# Patient Record
Sex: Male | Born: 1975 | Race: Black or African American | Hispanic: No | Marital: Single | Smoking: Current every day smoker
Health system: Southern US, Community
[De-identification: ages and names within clinical notes are randomized; demographics above are authoritative.]

## PROBLEM LIST (undated history)

## (undated) HISTORY — PX: OTHER SURGICAL HISTORY: SHX169

---

## 2004-01-29 ENCOUNTER — Inpatient Hospital Stay (HOSPITAL_COMMUNITY): Admission: EM | Admit: 2004-01-29 | Discharge: 2004-02-06 | Payer: Self-pay | Admitting: *Deleted

## 2004-02-03 ENCOUNTER — Encounter (INDEPENDENT_AMBULATORY_CARE_PROVIDER_SITE_OTHER): Payer: Self-pay | Admitting: *Deleted

## 2004-02-13 ENCOUNTER — Encounter: Admission: RE | Admit: 2004-02-13 | Discharge: 2004-02-13 | Payer: Self-pay | Admitting: Thoracic Surgery

## 2005-07-17 IMAGING — CR DG CHEST 1V PORT
1 series · 1 of 1 positions shown · non-contrast
Comparison: 01/29/04.

CLINICAL DATA: Pneumothorax. 
 CHEST PORTABLE, ONE VIEW 01/30/04 AT 5855 HOURS

[view not recorded]
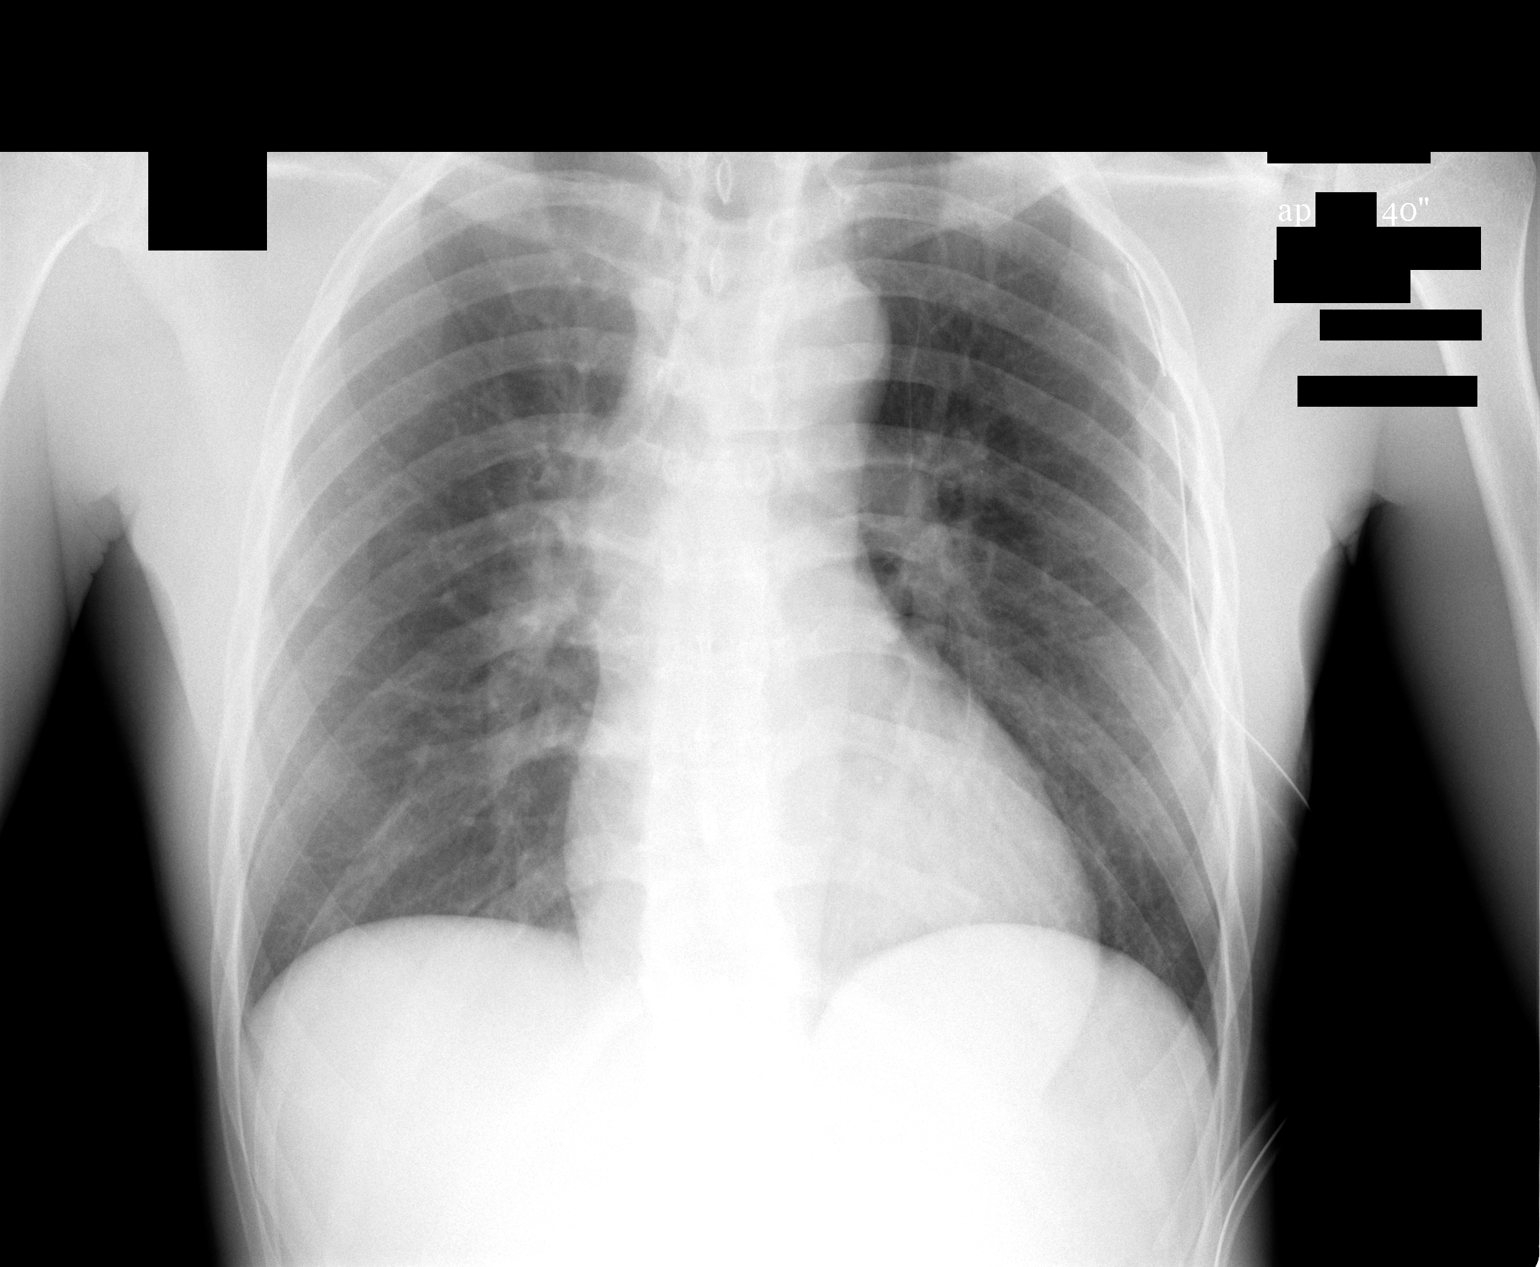

[1 of 1 positions shown; findings below may reference images not displayed]

FINDINGS: The left chest tube is stable.  A tiny left pneumothorax is unchanged.  The lungs are clear.  No effusions are seen. 
 IMPRESSION
 Stable tiny left apical pneumothorax.

## 2005-07-18 IMAGING — CR DG CHEST 1V PORT
1 series · 1 of 1 positions shown · non-contrast
Comparison: 01/30/04.

CLINICAL DATA: Spontaneous pneumothorax, chest tube. 
 CHEST PORTABLE, ONE VIEW 01/31/04 AT 4744 HOURS

[view not recorded]
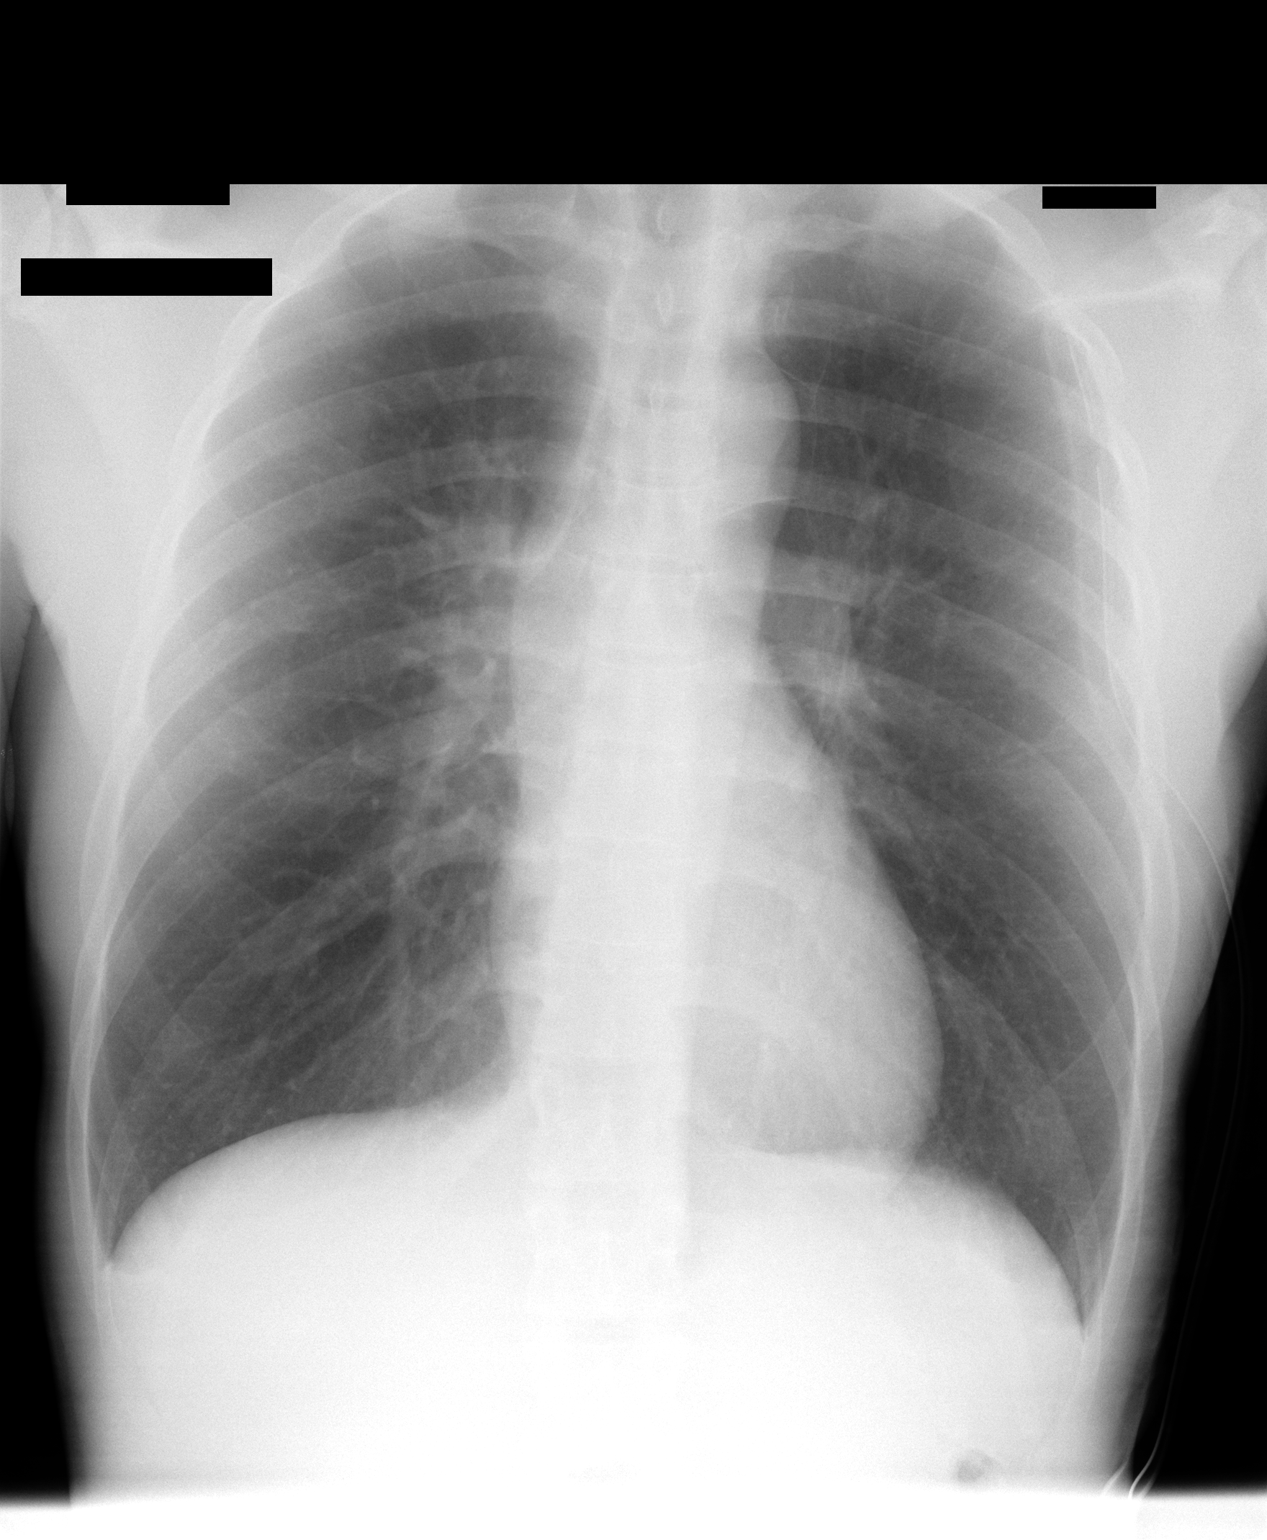

[1 of 1 positions shown; findings below may reference images not displayed]

Left-sided chest tube remains in place.   Previously-described small left apical pneumothorax not visualized on today?s study.  There is hyperinflation of the lungs.  No focal airspace opacities.  Heart is normal size. 
 IMPRESSION
 Left chest tube remains in place.  No definite pneumothorax.

## 2005-07-19 IMAGING — CT CT CHEST W/ CM
1 of 2 series · 14 of 30 positions shown, 18 images · IV contrast (100 ML OMNI 300)
Comparison: Multiple recent chest radiographs.

CLINICAL DATA: Left-sided spontaneous pneumothorax.  The patient is status post original insertion and now reinsertion of chest tube due to recurrent pneumothorax.  Further evaluation now performed with CT of the chest. 
 CT OF THE CHEST WITH CONTRAST 02/01/04

[Series 2: routine chest · axial · 0.70mm/px · z∈[-322,-22]mm · 14 of 71 slices shown, 18 images]
[im 6/71  mediastinal]
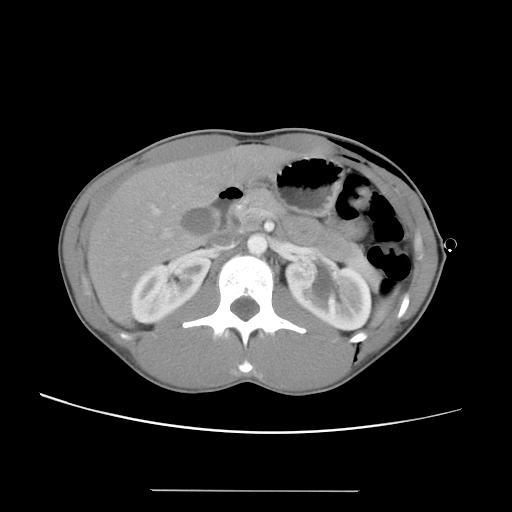
[im 6/71  lung]
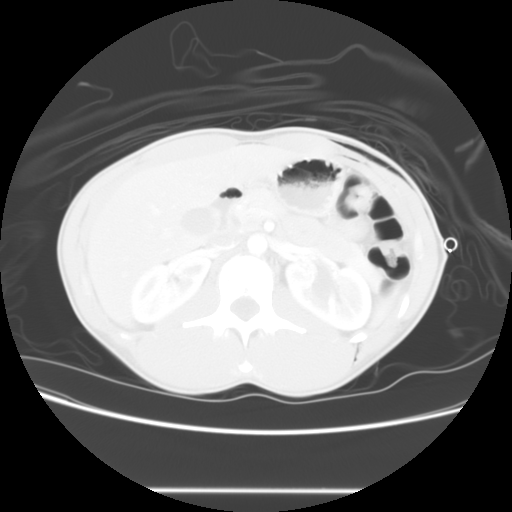
[im 11/71  lung]
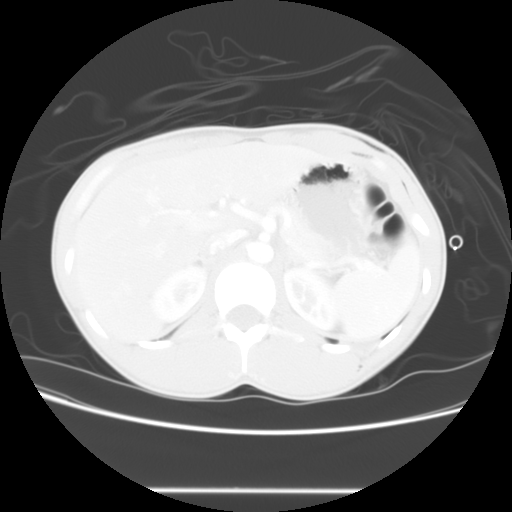
[im 16/71  lung]
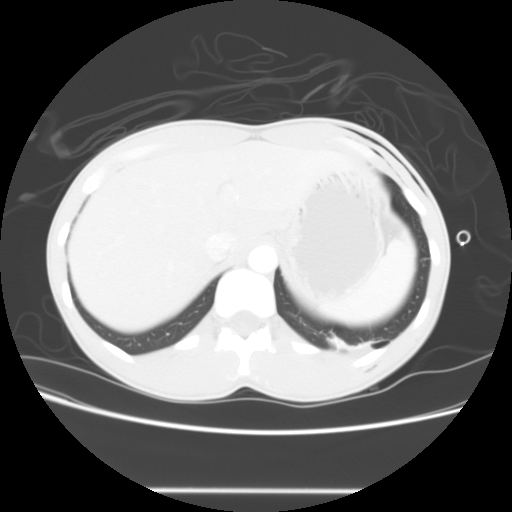
[im 21/71  lung]
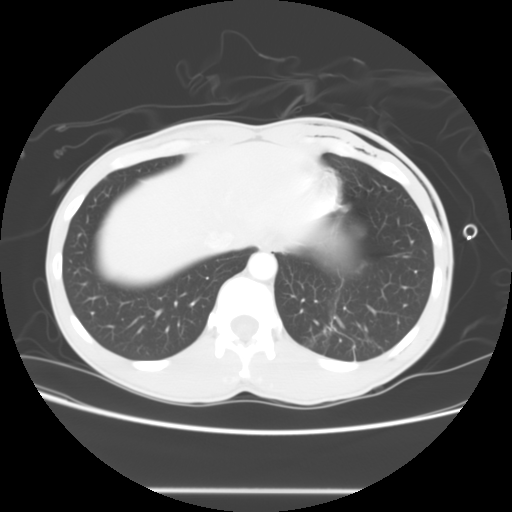
[im 26/71  mediastinal]
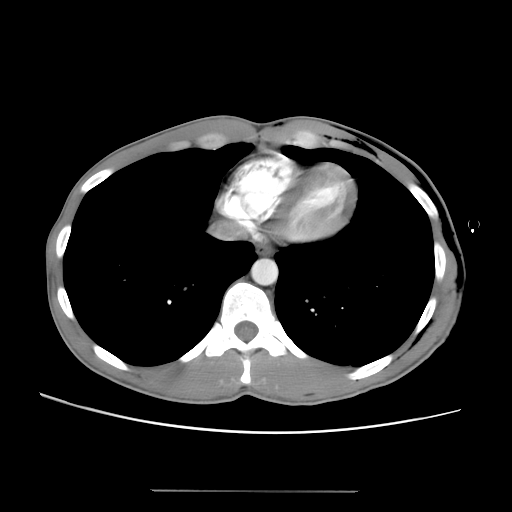
[im 26/71  lung]
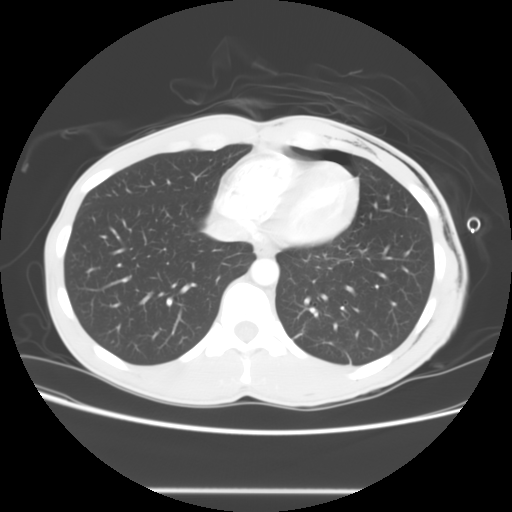
[im 31/71  lung]
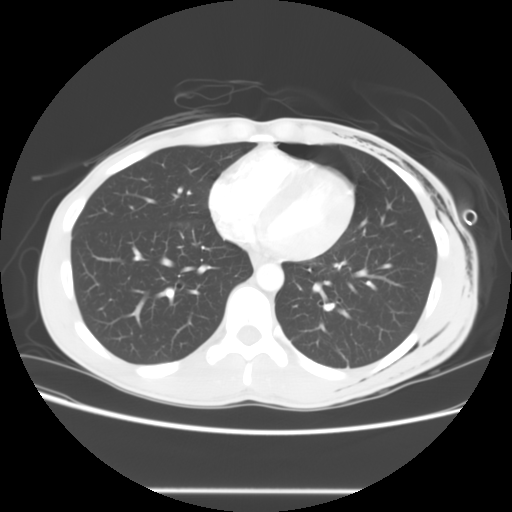
[im 35/71  lung]
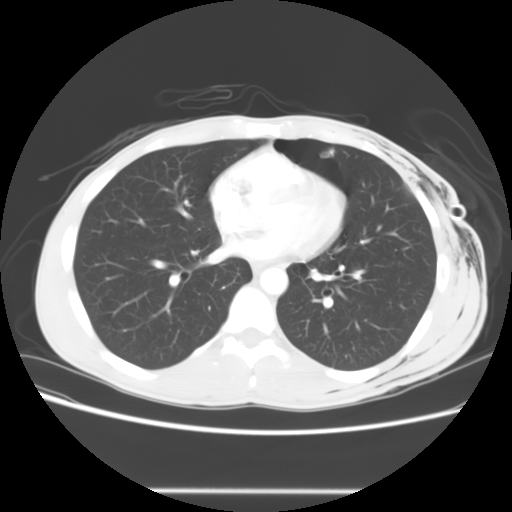
[im 36/71  lung]
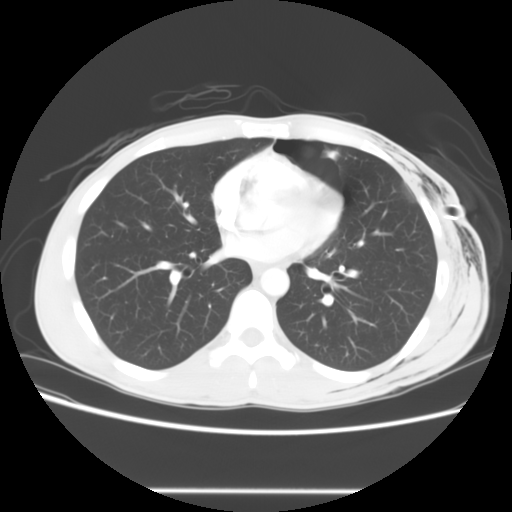
[im 41/71  mediastinal]
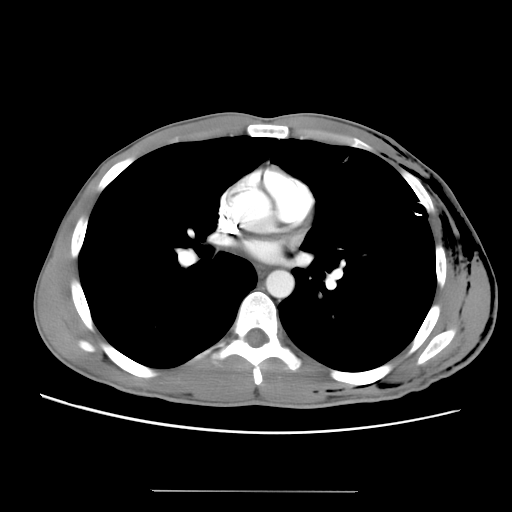
[im 41/71  lung]
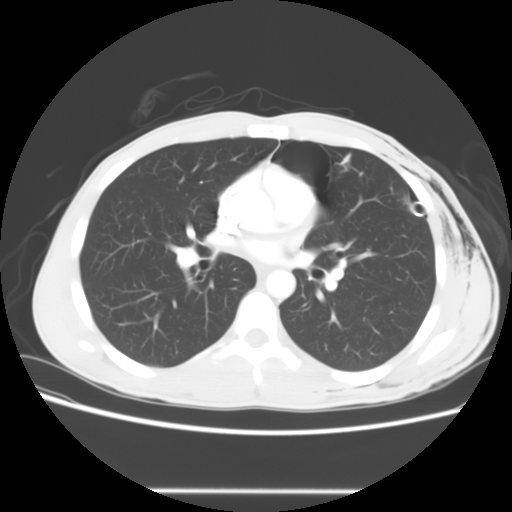
[im 46/71  lung]
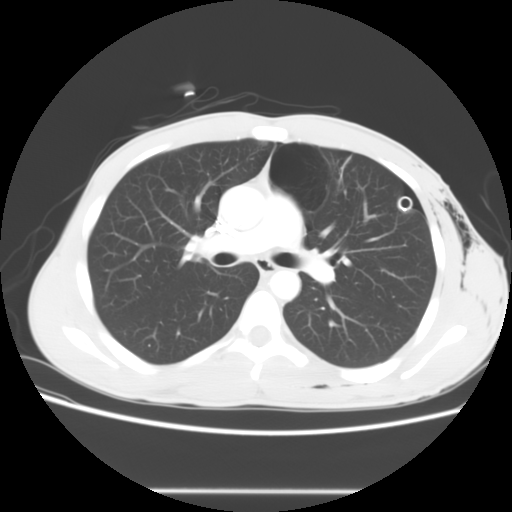
[im 51/71  lung]
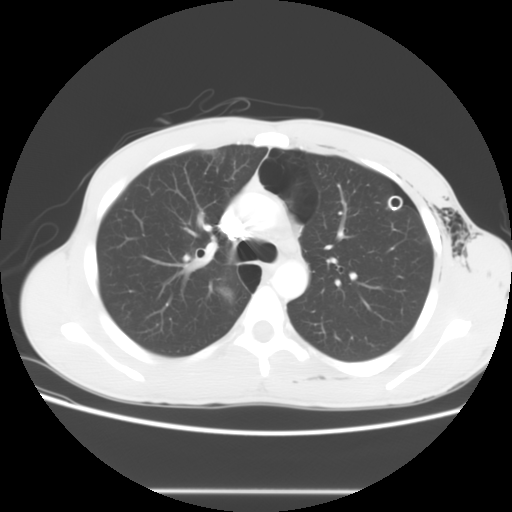
[im 56/71  lung]
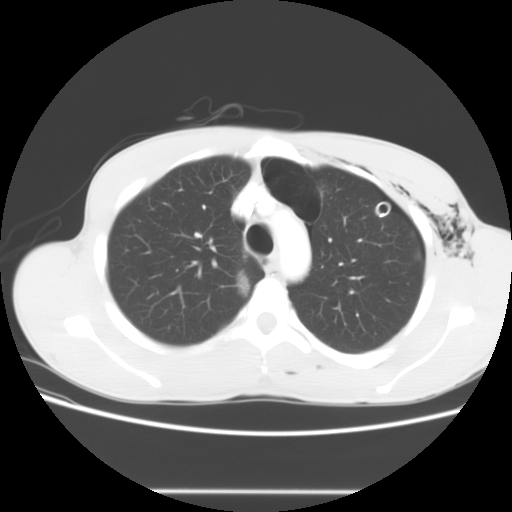
[im 61/71  mediastinal]
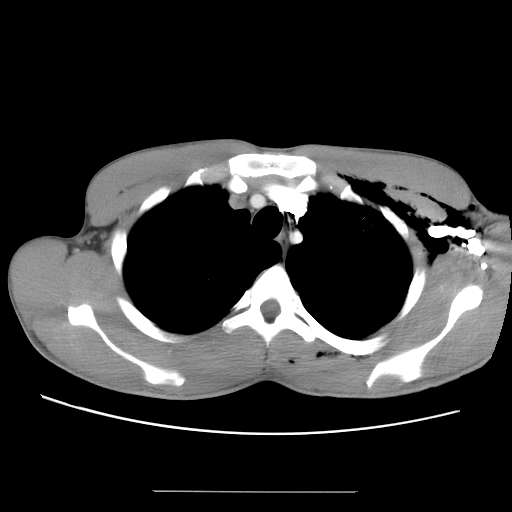
[im 61/71  lung]
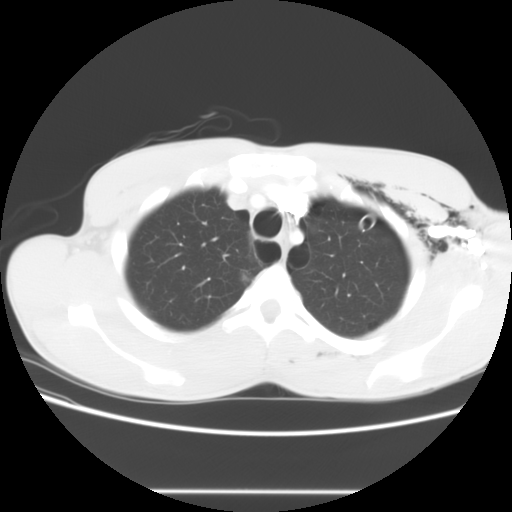
[im 66/71  lung]
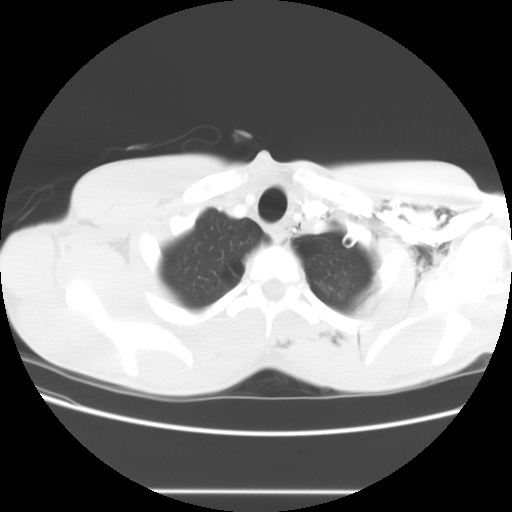

[14 of 30 positions shown; findings below may reference images not displayed]

Contrast:  100 cc of Omnipaque 300 IV. 
 Anterolateral chest tube in place with minimal residual component of apical pneumothorax present.  There are prominent medial blebs present adjacent to the mediastinum and extending all the way to the left lung apex.  Largest diameter of a single bleb is 5.8 cm.  Some subcutaneous air is present in the upper anterior chest wall and overlying the left lung apex.  No evidence of pleural fluid.  Minimal atelectasis at the left lung base.  The right lung demonstrates smaller posterior and medial bleb formation in the region of the azygoesophageal recess.  No pulmonary nodules or infiltrates.  
 IMPRESSION
 Near complete re-extension of the left lung after chest tube placement with only minimal component of pneumothorax remaining.  There is prominent bleb formation in the left anterior and medial hemithorax abutting the mediastinum.  Maximal bleb diameter is 5.8 cm in this region.  Smaller bleb in the region of the azygoesophageal recess on the right.

## 2006-09-11 ENCOUNTER — Emergency Department (HOSPITAL_COMMUNITY): Admission: EM | Admit: 2006-09-11 | Discharge: 2006-09-11 | Payer: Self-pay | Admitting: Emergency Medicine

## 2007-07-20 ENCOUNTER — Emergency Department (HOSPITAL_COMMUNITY): Admission: EM | Admit: 2007-07-20 | Discharge: 2007-07-20 | Payer: Self-pay | Admitting: Emergency Medicine

## 2007-12-15 ENCOUNTER — Emergency Department (HOSPITAL_COMMUNITY): Admission: AC | Admit: 2007-12-15 | Discharge: 2007-12-15 | Payer: Self-pay

## 2008-09-22 ENCOUNTER — Emergency Department (HOSPITAL_COMMUNITY): Admission: EM | Admit: 2008-09-22 | Discharge: 2008-09-22 | Payer: Self-pay | Admitting: Emergency Medicine

## 2010-10-04 ENCOUNTER — Encounter: Payer: Self-pay | Admitting: Thoracic Surgery
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
## 2011-01-29 NOTE — Op Note (Signed)
NAME:  Logan Huber, Logan Huber                          ACCOUNT NO.:  [id number]   MEDICAL RECORD NO.:  [id number]                   PATIENT TYPE:  INP   LOCATION:  5706                                 FACILITY:  [facility]   PHYSICIAN:  Ines Bloomer, M.D.              DATE OF BIRTH:  1976-03-24   DATE OF PROCEDURE:  02/03/2004  DATE OF DISCHARGE:                                 OPERATIVE REPORT   PREOPERATIVE DIAGNOSIS:  Persistent left pneumothorax with apical bulla.   POSTOPERATIVE DIAGNOSIS:  Persistent left pneumothorax with apical bulla.   OPERATION PERFORMED:  Left video assisted thoracoscopic surgery resection of  apical bulla.  Pleurectomy.   SURGEON:  Ines Bloomer, M.D.   ASSISTANT:  Coral Ceo, P.A.   ANESTHESIA:  General.   DESCRIPTION OF PROCEDURE:  After percutaneous insertion of all monitoring  lines, the patient underwent general anesthesia.  He was prepped and draped  in the usual sterile manner.  He was turned to the left lateral thoracotomy  position.  Two trocar sites were made in the anterior chest.  The previous  chest tube was removed and this was used for one trocar site and another  trocar site was made in the sixth intercostal space at the posterior  axillary line.  Two trocars were inserted. A 30 degree scope was inserted  and the patient had bulla in the apical posterior segment of the left upper  lobe.  No other bulla were seen except in this area but he had very large  bulla 3 to 4 cm in size.  A third incision was made over the third  intercostal space in the midaxillary line and a 1-1/2 Kaiser ring forceps  was inserted, grasping the largest bulla and this was then resected coming  in laterally with an EZ-45 Ethicon stapler, the bulla was removed and  another smaller one was identified at the apex and this was resected with  two applications of the stapler and finally a third one was identified in  between the superior and inferior bulla and this was  resected with the EZ-45  stapler.  It was checked for air leaks and none found. Then using the 2S and  4S as well as Kaiser ring forceps the pleura was stripped off the apex  superiorly, medially, laterally down to the seventh rib.  After this had  been done under direct vision with the scope, Marcaine block was done in the  usual fashion.  Two chest tubes were inserted through the lower trocar sites  and tied in place with 0 silk. The upper incision was closed with 3-0 Vicryl  and Dermabond for the skin.  Patient was returned to the recovery room in  stable condition.  Ines Bloomer, M.D.    DPB/MEDQ  D:  02/03/2004  T:  02/03/2004  Job:  [id number]

## 2011-01-29 NOTE — Discharge Summary (Signed)
Logan Huber, Logan Huber                          ACCOUNT NO.:  [id number]   MEDICAL RECORD NO.:  [id number]                   PATIENT TYPE:  INP   LOCATION:  2028                                 FACILITY:  [facility]   PHYSICIAN:  Ines Bloomer, M.D.              DATE OF BIRTH:  1976-08-15   DATE OF ADMISSION:  01/28/2004  DATE OF DISCHARGE:  02/06/2004                                 DISCHARGE SUMMARY   ADDENDUM:   ADMITTING PHYSICIAN:  Ines Bloomer, M.D.   ADMISSION DIAGNOSIS:  Shortness of breath.   SECONDARY DIAGNOSIS:  1. Spontaneous left pneumothorax status post left chest tube placement.  2. Persistent left pneumothorax with apical bulla, status post left video     assisted thoracoscopic surgery with resection of apical bulla.   PROCEDURE:  1. Placement of left chest tube for spontaneous pneumothorax placed on Jan 29, 2004 by Dr. Edwyna Shell, status post removal on Jan 31, 2004.  2. Recurrent left pneumothorax, 30-40% status post left chest tube removal     on Jan 31, 2004.  Replacement of left chest tube on Feb 01, 2004.  3. Left video-assisted thorascopic surgery and resection of apical bulla per     ectomy.  Surgeon was Dr. Edwyna Shell.  Procedure on Feb 03, 2004.   BRIEF HISTORY:  Please refer to previously dictated discharge summary.   HOSPITAL COURSE:  Please refer to previously dictated discharge summary.  In  addition, it was initially anticipated that Logan Huber would be discharged  home on Feb 01, 2004.  However, after his left chest tube was discontinued  on May 20th, his follow-up chest x-ray showed a moderate left pneumothorax.  The patient was in no acute distress.  Dr. Edwyna Shell did decide to observe Mr.  Huber overnight and repeat his chest x-ray the following morning.  However,  on Feb 01, 2004, his chest x-ray continued to show a persistent left  pneumothorax, 30-40%.  Another left chest tube was inserted and a chest tube  __________ scan was ordered.  Chest  tube T findings did show near complete  reextension of the left lung after chest tube placement with only minimal  component of pneumothorax remaining.  There was a prominent bleb formation  in the left anterior and medial hemithorax abutting the mediastinum.  Maximum blood diameter was 5.8 cm in that region.  A smaller bleb in the  region of the azygoesophageal recess on the right was also noted.  Based on  this finding, Dr. Edwyna Shell determined that Logan Huber would require left video-  assisted thorascopic surgery with resection of his blebs.  Subsequently, Mr.  Huber underwent this procedure the following day, Feb 03, 2004.  Two 28-  French chest tubes were placed during this procedure.  Logan Huber tolerated  this procedure well and was transferred from the operating room to the  recovery unit in stable  condition.  From the recovery unit, he was  transferred to a stepdown unit, 3300.   On the morning of postoperative day one, Logan Huber was hemodynamically  stable.  He was comfortable without complaints.  He was febrile however,  with a temperature maximum of 102.  His oxygen sats were 100% on room air.  His chest tube output had approximately 50 mL over the previous eight hours.  His wounds were clean and dry.  His morning chest x-ray showed no  pneumothorax.  His postoperative labs were also stable.  Later that day, his  Foley catheter and A line were discontinued.  His IV fluids were also  decreased as he was restarted on an oral diet.  He also begin mobilizing and  pulmonary toilet was encouraged.   On postoperative day two, Logan Huber continued to improve.  His temperature  was now 99.4.  His other vital signs were stable.  His oxygen saturations  remained excellent on room air.  His chest tubes showed no output as well as  no air leak.  His posterior chest tube had been removed the day before, and  his follow-up chest x-ray was stable, showing no pneumothorax.  The  remaining chest  tube was subsequently placed to water seal for the next two  hours.  Later that afternoon, his remaining chest tube was discontinued and  his follow-up chest x-ray was stable, only showing a tiny, less than 3% left  apical pneumothorax.  If his follow-up chest x-ray the following morning is  stable it is anticipated that he will be ready for discharge home Feb 06, 2004.  Of note, his morning labs on Feb 05, 2004 showed an elevated blood  glucose of 451 as well as an elevated potassium of 5.7.  His previous labs  had been within normal limits.  Therefore, it was felt that this was due to  lab error or more likely secondary to obtaining a blood specimen which was  from a vein that was directly receiving his current IV fluids of D5 1/2  normal saline with 20 mEq of potassium.  Therefore, his IV fluids were  discontinued and __________ was ordered for the following day.  By this  time, he was also tolerating an oral diet.  His bowel and bladder function  were also working appropriately, although he did require laxatives.  He was  also mobilizing independently and his pain was controlled on oral  medication.  As stated before, it was felt that he would be ready for  discharge on Feb 06, 2004 pending that he remains afebrile, his chest x-ray  remains stable, and his follow-up labs are within normal limits.   DISCHARGE INSTRUCTIONS:   DISCHARGE MEDICATIONS:  Tylox 1-2 tablets p.o. q.4h p.r.n. pain.   FOLLOW UP:  1. He is to follow-up with Dr. Edwyna Shell in the [facility] office on Thursday, February 13, 2004 at 4 p.m.  He will have a chest x-ray one hour before at the     [facility] and is instructed to bring his films with     him to his appointment with Dr. Edwyna Shell.   ADDITIONAL INSTRUCTIONS:  All additional discharge instructions are as  previously dictated.      Logan Huber, P.A.                  Ines Bloomer, M.D.   AWZ/MEDQ  D:  02/05/2004  T:  02/07/2004  Job:   [id number]   cc:   Ines Bloomer, M.D.  [address]  [place]  [place] [address]

## 2011-01-29 NOTE — Discharge Summary (Signed)
NAME:  KAIDAN, HARPSTER                          ACCOUNT NO.:  [id number]   MEDICAL RECORD NO.:  [id number]                   PATIENT TYPE:  INP   LOCATION:  5706                                 FACILITY:  [facility]   PHYSICIAN:  Coral Ceo, P.A.                  DATE OF BIRTH:  Feb 16, 1976   DATE OF ADMISSION:  01/28/2004  DATE OF DISCHARGE:  02/01/2004                                 DISCHARGE SUMMARY   PRIMARY ADMITTING DIAGNOSIS:  Shortness of breath.   ADDITIONAL/DISCHARGE DIAGNOSIS:  Spontaneous left pneumothorax.   PROCEDURE:  Placement of left chest tube for spontaneous pneumothorax.   HISTORY:  The patient is a 35 year old black male who initially presented to  [facility] on the date of this admission complaining of sudden onset of  chest discomfort and increasing dyspnea. A chest x-ray was performed there  which showed an 80% pneumothorax with some evidence of tracheal deviation.  He was subsequently sent to the [facility] ER for further evaluation and  treatment.   HOSPITAL COURSE:  He was seen by Dr. Dewayne Shorter in the emergency department  and it was confirmed that his chest tube showed an 80% spontaneous  pneumothorax. A left-sided chest tightness was placed in the emergency  department and he was transferred to the floor for management. He has been  hospitalized now for 48 hours. His lung has fully re-expanded by chest x-ray  and he has no further evidence of pneumothorax. His chest tube initially was  maintained on suction, was weaned to waterseal, no air leak was noted, and  had subsequently been discontinued. A follow-up chest x-ray after chest tube  removal is pending. He has otherwise done well during this admission. He has  remained afebrile and all vital signs have been stable. He has been  tolerating a regular diet and is having normal bowel and bladder function.  He is ambulating in the halls without difficulty. He is maintaining oxygen  saturations of greater  than 90% on room air. His white blood cell count is  stable at 7000, hemoglobin of 14.  Other labs are within normal limits. It  is felt that if his follow-up chest x-ray after chest tube removal is stable  and a PA and lateral draw done on the morning of Feb 01, 2004, shows no  recurrence of his pneumothorax, he will be ready for discharge home on the  morning of Feb 01, 2004.   DISCHARGE MEDICATIONS:  Tylox one to two q.4h. p.r.n. pain.   DISCHARGE INSTRUCTIONS:  He is to refrain from driving, heavy lifting, or  strenuous activity. He may continue daily ambulation and use of incentive  spirometry. He may shower daily and clean his incisions with soap and water.   FOLLOWUP:  He will see Dr. Edwyna Shell back in the office in one week with a  chest x-ray. He is asked to call our  office in the interim if he experiences  any problem or has questions.                                                Coral Ceo, P.A.    GC/MEDQ  D:  01/31/2004  T:  02/01/2004  Job:  [id number]

## 2011-01-29 NOTE — H&P (Signed)
NAME:  Logan Huber, Logan Huber                          ACCOUNT NO.:  [id number]   MEDICAL RECORD NO.:  [id number]                   PATIENT TYPE:  EMS   LOCATION:  MAJO                                 FACILITY:  [facility]   PHYSICIAN:  Ines Bloomer, M.D.              DATE OF BIRTH:  07-21-1976   DATE OF ADMISSION:  01/28/2004  DATE OF DISCHARGE:                                HISTORY & PHYSICAL   CHIEF COMPLAINT:  Shortness of breath.   HISTORY OF PRESENT ILLNESS:  This 35 year old African American male had a  persistent and increasing dyspnea, because of this, he went to [facility], __________  and chest x-ray was obtained which revealed a 80%  pneumothorax with some pinching and tracheal deviation.  He was referred for  treatment.  He has had left chest pain.  He does smoke one pack per day.  Has not had this episode before.  The cough has been going on for about 24  hours.  No history of a URI, excessive purulent sputum, no history of  hemoptysis.   PAST MEDICAL HISTORY:  1. He is on no medications.  2. He has no allergies.  3. He has no history of fractures.  4. No surgical history.   SOCIAL HISTORY:  He is unmarried.  He comes in with his girlfriend.  Denies  any drug use.  Occasional alcohol.   REVIEW OF SYSTEMS:  He has had coughing, no history of sore throat, no  history of adenopathy, no history of tachycardia or palpitations, no history  of nausea and vomiting, constipation or diarrhea, denies any dysuria,  testicular pain, frequency in urine, no history of fractures, no history of  DVT, no history of headaches or seizures.  Denies any hearing or eye sight  changes.   PHYSICAL EXAMINATION:  VITAL SIGNS:  His blood pressure is 110/70, temp is  98, pulse is 130, respirations are 20.  His weight is 134 pounds.  Sats were  98%.  HEENT:  Head is atraumatic.  Pupils are equal, reactive to light and  accommodation.  Extraocular movements are normal.  Ears, tympanic  membranes  are intact.  Nares:  There is no septal deviation.  Mouth:  There is no oral  lesions.  The uvula is in the midline.  Tongue is in the midline.  There is  no supraclavicular or axillary adenopathy.  No jugular venous distention.  Trachea is in the midline.  LUNGS:  There are normal breath sounds on the  right and no breath sounds on the left.  HEART:  Tachycardic but no murmurs heard.  ABDOMEN:  Soft.  There is no hepatosplenomegaly.  Bowel sounds are normal  and pulses are 2+.  There is no clubbing or edema.  NEUROLOGIC:  He is oriented x 3.  Deep tendon reflexes are 2+.  Cranial  nerves II-XII are intact.  His __________  .  IMPRESSION:  1. Left spontaneous pneumothorax with 80% tension pneumothorax.  2. Tobacco abuse.   PLAN:  Insertion of left chest tube.                                                Ines Bloomer, M.D.    DPB/MEDQ  D:  01/28/2004  T:  01/29/2004  Job:  [id number]

## 2011-06-08 LAB — POCT I-STAT, CHEM 8
BUN: 12
Calcium, Ion: 1.18
Creatinine, Ser: 1.5
TCO2: 14

## 2011-06-22 LAB — GC/CHLAMYDIA PROBE AMP, GENITAL: Chlamydia, DNA Probe: POSITIVE — AB

## 2013-01-31 ENCOUNTER — Emergency Department (INDEPENDENT_AMBULATORY_CARE_PROVIDER_SITE_OTHER)
Admission: EM | Admit: 2013-01-31 | Discharge: 2013-01-31 | Disposition: A | Discharge status: Home / Self Care | Payer: Self-pay | Source: Home / Self Care

## 2013-01-31 ENCOUNTER — Encounter: Payer: Self-pay | Admitting: Internal Medicine

## 2013-01-31 ENCOUNTER — Encounter (HOSPITAL_COMMUNITY): Payer: Self-pay | Admitting: Emergency Medicine

## 2013-01-31 ENCOUNTER — Other Ambulatory Visit (HOSPITAL_COMMUNITY)
Admission: RE | Admit: 2013-01-31 | Discharge: 2013-01-31 | Disposition: A | Discharge status: Home / Self Care | Payer: Self-pay | Source: Ambulatory Visit | Attending: Emergency Medicine | Admitting: Emergency Medicine

## 2013-01-31 ENCOUNTER — Ambulatory Visit: Payer: Self-pay | Attending: Internal Medicine | Admitting: Internal Medicine

## 2013-01-31 VITALS — BP 128/86 | HR 73 | Temp 99.2°F | Resp 20 | Ht 70.5 in | Wt 130.0 lb

## 2013-01-31 DIAGNOSIS — R21 Rash and other nonspecific skin eruption: Secondary | ICD-10-CM

## 2013-01-31 DIAGNOSIS — Z113 Encounter for screening for infections with a predominantly sexual mode of transmission: Secondary | ICD-10-CM | POA: Insufficient documentation

## 2013-01-31 LAB — COMPREHENSIVE METABOLIC PANEL
Alkaline Phosphatase: 73 U/L (ref 39–117)
CO2: 26 mEq/L (ref 19–32)
Creat: 1.15 mg/dL (ref 0.50–1.35)
Glucose, Bld: 99 mg/dL (ref 70–99)
Total Bilirubin: 0.3 mg/dL (ref 0.3–1.2)

## 2013-01-31 LAB — CBC WITH DIFFERENTIAL/PLATELET
Eosinophils Relative: 4 % (ref 0–5)
HCT: 40.9 % (ref 39.0–52.0)
Lymphocytes Relative: 27 % (ref 12–46)
Lymphs Abs: 1.7 10*3/uL (ref 0.7–4.0)
MCV: 86.8 fL (ref 78.0–100.0)
Monocytes Absolute: 0.8 10*3/uL (ref 0.1–1.0)
Monocytes Relative: 12 % (ref 3–12)
RBC: 4.71 MIL/uL (ref 4.22–5.81)
WBC: 6.4 10*3/uL (ref 4.0–10.5)

## 2013-01-31 LAB — TSH: TSH: 2.213 u[IU]/mL (ref 0.350–4.500)

## 2013-01-31 MED ORDER — HYDROXYZINE HCL 25 MG PO TABS: 25.0000 mg | ORAL_TABLET | Freq: Three times a day (TID) | ORAL | Status: AC | PRN

## 2013-01-31 MED ORDER — PREDNISONE 20 MG PO TABS: 20.0000 mg | ORAL_TABLET | Freq: Every day | ORAL | Status: DC

## 2013-01-31 NOTE — ED Notes (Signed)
Pt c/o skin rash x 6 months on both hands, neck, and face. Skin is darker and scaly. Skin on neck becomes so tight that it is hard to turn his head. Also has swollen lymph nodes around neck and lesions on face. Pt also would like to be checked for STDs. Has had multiple partners in the last 6 months. No symptoms or signs of STDs. Patient is alert and oriented.

## 2013-01-31 NOTE — ED Provider Notes (Signed)
History     CSN: [id number]  Arrival date & time 01/31/13  1239   None     Chief Complaint  Patient presents with  . Rash  . SEXUALLY TRANSMITTED DISEASE    (Consider location/radiation/quality/duration/timing/severity/associated sxs/prior treatment) HPI Comments: Pt presents c/o 6 months of relapsing and remitting dry, cracking skin on his hands and now on his neck.  He has been seen on the ER and by a dermatologist for this but has not yet found an answer.  He has been putting topical hydrocortisone and antifungals on it which is not helping.  He has also noted he has some bumps on the sides of his neck for the past 4-6 months as well.  Denies any recent changes in this rash.  Denies fever, chills, SOB, or any other symptoms.  He also wants to get routing STD testing done.  Denies any discharge, dysuria, or other STD related symptoms.    Patient is a 37 y.o. male presenting with rash.  Rash   History reviewed. No pertinent past medical history.  History reviewed. No pertinent past surgical history.  Family History  Problem Relation Age of Onset  . Sarcoidosis Mother     History  Substance Use Topics  . Smoking status: Current Every Day Smoker -- 1.00 packs/day    Types: Cigarettes  . Smokeless tobacco: Never Used  . Alcohol Use: Yes     Comment: occasionally      Review of Systems  Constitutional: Negative.   HENT: Negative.   Eyes: Negative.   Respiratory: Negative.   Cardiovascular: Negative.   Gastrointestinal: Negative.   Endocrine: Negative.   Genitourinary: Negative.   Musculoskeletal: Negative.   Skin: Positive for rash.  Allergic/Immunologic: Negative.   Neurological: Negative.   Hematological: Negative.   Psychiatric/Behavioral: Negative.     Allergies  Review of patient's allergies indicates no known allergies.  Home Medications  No current outpatient prescriptions on file.  BP 128/90  Pulse 79  Temp(Src) 99.5 F (37.5 C) (Oral)  Resp  18  SpO2 100%  Physical Exam  Nursing note and vitals reviewed. Constitutional: He is oriented to person, place, and time. Vital signs are normal. He appears well-developed and well-nourished. No distress.  HENT:  Head: Normocephalic and atraumatic.  Right Ear: External ear normal.  Left Ear: External ear normal.  Nose: Nose normal.  Eyes: Conjunctivae and EOM are normal. Pupils are equal, round, and reactive to light. Right eye exhibits no discharge. Left eye exhibits no discharge.  Neck: Normal range of motion. Neck supple. No JVD present. No tracheal deviation present.  Cardiovascular: Normal rate, regular rhythm and normal heart sounds.  Exam reveals no gallop and no friction rub.   No murmur heard. Pulmonary/Chest: Effort normal and breath sounds normal. No stridor. No respiratory distress. He has no wheezes. He has no rales. He exhibits no tenderness.  Abdominal: Soft. Normal appearance and bowel sounds are normal. He exhibits no distension. There is no tenderness. There is no guarding.  Musculoskeletal: Normal range of motion. He exhibits no edema and no tenderness.  Lymphadenopathy:    He has no cervical adenopathy.  Neurological: He is alert and oriented to person, place, and time. He has normal strength. No cranial nerve deficit.  Skin: Skin is warm and dry. Rash (exscoriated, lichenified skin on the dorsal hands bilaterally and on the posterior neck.  ) noted. He is not diaphoretic. No erythema. No pallor.  Psychiatric: He has a normal mood and affect.  His behavior is normal. Judgment normal.    ED Course  Procedures (including critical care time)  Labs Reviewed - No data to display No results found.   1. Rash   2. Screen for STD (sexually transmitted disease)       MDM  Will Rx oral prednisone for 1 week and recommend a quality emollient for this pt and have them f/u with PCP for definitive management.  Discontinue topical corticosteroids for now.  Pt will need to  establish PCP at adult clinic.       Graylon Good, PA-C 01/31/13 [phone]

## 2013-01-31 NOTE — Progress Notes (Signed)
Patient ID: Logan Huber, male   DOB: 1976-01-30, 37 y.o.   MRN: [id number]   CC: body rash  HPI: Pt is 37 yo male with no significant past medical history, barber in occupation, presents to clinic with main concern of 2-3 months duration of macular body rash, hyperpigmentation, located mostly in the dorsum of the hands and neck area, face, associated with constant itching, subjective fevers, chills, night sweats, malaise, 5 lbs weight loss unintentional, poor oral intake. Pt denies similar events in the past but reports family history of sarcoidosis on mother side. Pt denies sick contacts or exposures and no recent hospitalizations, no recent traveling.   No Known Allergies History reviewed. No pertinent past medical history. Current Outpatient Prescriptions on File Prior to Visit  Medication Sig Dispense Refill  . predniSONE (DELTASONE) 20 MG tablet Take 1 tablet (20 mg total) by mouth daily. Take in the morning with or without food  7 tablet  0   No current facility-administered medications on file prior to visit.   Family History  Problem Relation Age of Onset  . Sarcoidosis Mother    History   Social History  . Marital Status: Single    Spouse Name: N/A    Number of Children: N/A  . Years of Education: N/A   Occupational History  . Not on file.   Social History Main Topics  . Smoking status: Current Every Day Smoker -- 1.00 packs/day    Types: Cigarettes  . Smokeless tobacco: Never Used  . Alcohol Use: Yes     Comment: occasionally  . Drug Use: 2.00 per week    Special: Cocaine, Marijuana  . Sexually Active: Yes   Other Topics Concern  . Not on file   Social History Narrative  . No narrative on file    Review of Systems  Constitutional: Negative for fever, chills, diaphoresis, activity change, appetite change and fatigue.  HENT: Negative for ear pain, nosebleeds, congestion, facial swelling, rhinorrhea, neck pain, neck stiffness and ear discharge.   Eyes:  Negative for pain, discharge, redness, itching and visual disturbance.  Respiratory: Negative for cough, choking, chest tightness, shortness of breath, wheezing and stridor.   Cardiovascular: Negative for chest pain, palpitations and leg swelling.  Gastrointestinal: Negative for abdominal distention.  Genitourinary: Negative for dysuria, urgency, frequency, hematuria, flank pain, decreased urine volume, difficulty urinating and dyspareunia.  Musculoskeletal: Negative for back pain, joint swelling, arthralgias and gait problem.  Neurological: Negative for dizziness, tremors, seizures, syncope, facial asymmetry, speech difficulty, weakness, light-headedness, numbness and headaches.  Hematological: Negative for adenopathy. Does not bruise/bleed easily.  Psychiatric/Behavioral: Negative for hallucinations, behavioral problems, confusion, dysphoric mood, decreased concentration and agitation.    Objective:   Filed Vitals:   01/31/13 1555  BP: 128/86  Pulse: 73  Temp: 99.2 F (37.3 C)  Resp: 20    Physical Exam  Constitutional: Appears well-developed and well-nourished. No distress.  HENT: Normocephalic. External right and left ear normal. Oropharynx is clear and moist.  Eyes: Conjunctivae and EOM are normal. PERRLA, no scleral icterus.  Neck: Normal ROM. Neck supple. No JVD. No tracheal deviation. No thyromegaly.  CVS: RRR, S1/S2 +, no murmurs, no gallops, no carotid bruit.  Pulmonary: Effort and breath sounds normal, no stridor, rhonchi, wheezes, rales.  Abdominal: Soft. BS +,  no distension, tenderness, rebound or guarding.  Musculoskeletal: Normal range of motion. No edema and no tenderness.  Lymphadenopathy: No lymphadenopathy noted, cervical, inguinal. Neuro: Alert. Normal reflexes, muscle tone coordination. No cranial  nerve deficit. Skin: Hyperpigmented skin over the dorsum of the hands with area of excoriation, small nodules on the both sides of the neck, 2 on each side and 0.5 cm  in diameter, tender to palpation, non draining   Psychiatric: Normal mood and affect. Behavior, judgment, thought content normal.   Lab Results  Component Value Date   HGB 15.3 12/15/2007   HCT 45.0 12/15/2007   Lab Results  Component Value Date   CREATININE 1.5 12/15/2007   BUN 12 12/15/2007   NA 139 12/15/2007   K 3.8 12/15/2007   CL 106 12/15/2007    No results found for this basename: HGBA1C   Lipid Panel  No results found for this basename: chol, trig, hdl, cholhdl, vldl, ldlcalc       Assessment and plan:   Skin rash - unclear etiology, pt already on Prednisone as started by dermatologist and I have advised to complete the therapy - will check HIV, CBC, CMET, A1C, ANA, SSA and SSB today to start with - prescribe hydroxyzine for symptomatic relief of rash

## 2013-01-31 NOTE — Patient Instructions (Addendum)

## 2013-01-31 NOTE — Progress Notes (Signed)
Patient was referred by [facility] to come here for treatment and to establish care.

## 2013-02-01 LAB — SJOGREN'S SYNDROME ANTIBODS(SSA + SSB)
SSA (Ro) (ENA) Antibody, IgG: 1 AU/mL (ref <30)
SSB (La) (ENA) Antibody, IgG: 9 AU/mL (ref <30)

## 2013-02-01 LAB — ANA: Anti Nuclear Antibody(ANA): NEGATIVE

## 2013-02-01 LAB — HIV ANTIBODY (ROUTINE TESTING W REFLEX): HIV: NONREACTIVE

## 2013-02-01 NOTE — ED Provider Notes (Signed)
Medical screening examination/treatment/procedure(s) were performed by non-physician practitioner and as supervising physician I was immediately available for consultation/collaboration.   MORENO-COLL,Dauntae Derusha; MD  Parag Dorton Moreno-Coll, MD 02/01/13 1002 

## 2013-02-13 ENCOUNTER — Telehealth: Payer: Self-pay | Admitting: General Practice

## 2013-02-13 NOTE — Telephone Encounter (Signed)
02/12/13 Patientt made aware all labs WNL per Dr. Lucretia Roers. P.Keir Viernes,RN BSN [facility]

## 2013-02-13 NOTE — Telephone Encounter (Signed)
02/13/13 Please review lab results  Patient requesting results. Thanks P. [facility] BSN

## 2013-02-13 NOTE — Telephone Encounter (Signed)
Labs all wnl.

## 2013-02-14 ENCOUNTER — Emergency Department (INDEPENDENT_AMBULATORY_CARE_PROVIDER_SITE_OTHER)
Admission: EM | Admit: 2013-02-14 | Discharge: 2013-02-14 | Disposition: A | Discharge status: Home / Self Care | Payer: Self-pay | Source: Home / Self Care | Attending: Family Medicine | Admitting: Family Medicine

## 2013-02-14 ENCOUNTER — Encounter (HOSPITAL_COMMUNITY): Payer: Self-pay | Admitting: Emergency Medicine

## 2013-02-14 DIAGNOSIS — W19XXXA Unspecified fall, initial encounter: Secondary | ICD-10-CM

## 2013-02-14 DIAGNOSIS — M542 Cervicalgia: Secondary | ICD-10-CM

## 2013-02-14 DIAGNOSIS — M549 Dorsalgia, unspecified: Secondary | ICD-10-CM

## 2013-02-14 MED ORDER — PREDNISONE 20 MG PO TABS: 20.0000 mg | ORAL_TABLET | Freq: Every day | ORAL | Status: DC

## 2013-02-14 MED ORDER — TRAMADOL-ACETAMINOPHEN 37.5-325 MG PO TABS: 1.0000 | ORAL_TABLET | Freq: Four times a day (QID) | ORAL | Status: AC | PRN

## 2013-02-14 MED ORDER — NAPROXEN 500 MG PO TABS: 500.0000 mg | ORAL_TABLET | Freq: Two times a day (BID) | ORAL | Status: AC

## 2013-02-14 NOTE — ED Provider Notes (Signed)
History     CSN: [id number]  Arrival date & time 02/14/13  1517   First MD Initiated Contact with Patient 02/14/13 1550      Chief Complaint  Patient presents with  . Fall    (Consider location/radiation/quality/duration/timing/severity/associated sxs/prior treatment) HPI Comments: Pt presents for eval of neck and shoulder pain after falling this weekend.  He was helping to move his neighbors furniture when he slipped and fell.  He has taken some of his neighbors oxycodone which he states helped a lot.  Denies any numbness or tingling, or any swelling.  Overall the pain is less now than it was.  He does note a clicking sound in his arm/shoulder when he moves it that he thinks is new.    Patient is a 37 y.o. male presenting with fall.  Fall Pertinent negatives include no chest pain, no abdominal pain and no shortness of breath.    History reviewed. No pertinent past medical history.  Past Surgical History  Procedure Laterality Date  . Cyst removal lung      Family History  Problem Relation Age of Onset  . Sarcoidosis Mother     History  Substance Use Topics  . Smoking status: Current Every Day Smoker -- 1.00 packs/day    Types: Cigarettes  . Smokeless tobacco: Never Used  . Alcohol Use: Yes     Comment: occasionally      Review of Systems  Constitutional: Negative for fever, chills and fatigue.  HENT: Positive for neck pain. Negative for sore throat and neck stiffness.   Eyes: Negative for visual disturbance.  Respiratory: Negative for cough and shortness of breath.   Cardiovascular: Negative for chest pain, palpitations and leg swelling.  Gastrointestinal: Negative for nausea, vomiting, abdominal pain, diarrhea and constipation.  Genitourinary: Negative for dysuria, urgency, frequency and hematuria.  Musculoskeletal: Positive for back pain and arthralgias. Negative for myalgias.  Skin: Negative for rash.  Neurological: Negative for dizziness, weakness and  light-headedness.    Allergies  Review of patient's allergies indicates no known allergies.  Home Medications   Current Outpatient Rx  Name  Route  Sig  Dispense  Refill  . hydrOXYzine (ATARAX/VISTARIL) 25 MG tablet   Oral   Take 1 tablet (25 mg total) by mouth 3 (three) times daily as needed for itching.   65 tablet   0   . naproxen (NAPROSYN) 500 MG tablet   Oral   Take 1 tablet (500 mg total) by mouth 2 (two) times daily.   60 tablet   0   . predniSONE (DELTASONE) 20 MG tablet   Oral   Take 1 tablet (20 mg total) by mouth daily. Take in the morning with or without food   7 tablet   0   . traMADol-acetaminophen (ULTRACET) 37.5-325 MG per tablet   Oral   Take 1 tablet by mouth every 6 (six) hours as needed for pain.   30 tablet   0     BP 114/85  Pulse 89  Temp(Src) 98.7 F (37.1 C) (Oral)  Resp 18  SpO2 100%  Physical Exam  Constitutional: He is oriented to person, place, and time. He appears well-developed and well-nourished. No distress.  HENT:  Head: Normocephalic and atraumatic.  Eyes: EOM are normal. Pupils are equal, round, and reactive to light.  Cardiovascular: Normal rate and regular rhythm.  Exam reveals no gallop and no friction rub.   No murmur heard. Pulmonary/Chest: Effort normal and breath sounds normal. No respiratory  distress. He has no wheezes. He has no rales.  Abdominal: Soft. There is no tenderness.  Musculoskeletal: Normal range of motion. He exhibits no edema and no tenderness.  Neurological: He is oriented to person, place, and time.  Skin: Skin is warm and dry. No rash noted.  Psychiatric: He has a normal mood and affect. Judgment normal.    ED Course  Procedures (including critical care time)  Labs Reviewed - No data to display No results found.   1. Fall, initial encounter       MDM  Pt has normal ROM without bony or soft tissue tenderness in all areas he mentioned.  The clicking is coming from the [facility] joint on the  right, joint is not unstable at all and there is no crepitus.  This should get better with NSAIDs and with time.  Pt will f/u here or with PCP in 1 weeks for re-check    Meds ordered this encounter  Medications  . predniSONE (DELTASONE) 20 MG tablet    Sig: Take 1 tablet (20 mg total) by mouth daily. Take in the morning with or without food    Dispense:  7 tablet    Refill:  0  . naproxen (NAPROSYN) 500 MG tablet    Sig: Take 1 tablet (500 mg total) by mouth 2 (two) times daily.    Dispense:  60 tablet    Refill:  0  . traMADol-acetaminophen (ULTRACET) 37.5-325 MG per tablet    Sig: Take 1 tablet by mouth every 6 (six) hours as needed for pain.    Dispense:  30 tablet    Refill:  0           Graylon Good, PA-C 02/14/13 [phone]

## 2013-02-14 NOTE — ED Notes (Signed)
Pt c/o fall x 4 days ago while moving furniture. Hurt his neck, back, and legs. Took some of his neighbors prescription pain pills not sure what they were but they helped. Hurts to walk and to bend over. Patient is alert and oriented.

## 2013-02-26 ENCOUNTER — Ambulatory Visit: Payer: Self-pay

## 2013-02-26 NOTE — ED Provider Notes (Signed)
Medical screening examination/treatment/procedure(s) were performed by resident physician or non-physician practitioner and as supervising physician I was immediately available for consultation/collaboration.   KINDL,JAMES DOUGLAS MD.   James D Kindl, MD 02/26/13 2024 

## 2013-03-05 ENCOUNTER — Encounter (HOSPITAL_COMMUNITY): Payer: Self-pay | Admitting: Emergency Medicine

## 2013-03-05 ENCOUNTER — Emergency Department (HOSPITAL_COMMUNITY)
Admission: EM | Admit: 2013-03-05 | Discharge: 2013-03-05 | Disposition: A | Discharge status: Home / Self Care | Payer: Self-pay | Attending: Emergency Medicine | Admitting: Emergency Medicine

## 2013-03-05 DIAGNOSIS — R509 Fever, unspecified: Secondary | ICD-10-CM | POA: Insufficient documentation

## 2013-03-05 DIAGNOSIS — R21 Rash and other nonspecific skin eruption: Secondary | ICD-10-CM | POA: Insufficient documentation

## 2013-03-05 DIAGNOSIS — R5381 Other malaise: Secondary | ICD-10-CM | POA: Insufficient documentation

## 2013-03-05 DIAGNOSIS — L299 Pruritus, unspecified: Secondary | ICD-10-CM | POA: Insufficient documentation

## 2013-03-05 DIAGNOSIS — F17200 Nicotine dependence, unspecified, uncomplicated: Secondary | ICD-10-CM | POA: Insufficient documentation

## 2013-03-05 MED ORDER — DIPHENHYDRAMINE HCL 25 MG PO CAPS
25.0000 mg | ORAL_CAPSULE | Freq: Once | ORAL | Status: AC
  Administered 2013-03-05: 25 mg via ORAL
  Filled 2013-03-05: qty 1

## 2013-03-05 MED ORDER — PREDNISONE 10 MG PO TABS: 60.0000 mg | ORAL_TABLET | Freq: Every day | ORAL | Status: DC

## 2013-03-05 NOTE — ED Notes (Signed)
Pt c/o rash to face x several weeks worse today; per family sleeping x 2 days and sts feels tired

## 2013-03-05 NOTE — ED Provider Notes (Signed)
37 year old male has been having problems with rashes for about a year. He had a rash on his hands for which she was seen by a dermatologist in [place] in prescribed a course of prednisone 20 mg a day which did seem to give some relief. Today, he woke up with a rash on his face. The rash is pruritic and he states his skin feels tight. It does extend onto his neck. He was seen in urgent care last month and had testing done for autoimmune diseases and had not heard a report on this. He's never really had any joint symptoms although he does relate that there was one time where his knees were aching when he was walking but that had resolved spontaneously. He denies fever. He states he has not been taking any medications except for what has been prescribed for him. On exam, he has a rash on the face and neck which seems to be limited to the sun exposed areas. The skin is hyperkeratotic and in areas it is flaking off revealing erythema of underlying skin. There is also some thickened skin on the dorsum of his hands which is apparently where his prior rash had pain and had improved. There is some dry skin on his right forearm which seems to be different from the other rash and he states that area is nonpruritic. He denies any swallowing problems. Causes rash and weakness is not clear. His old records were reviewed and he had negative antinuclear antibody testing as well as normal metabolic panel and CBC. The rash definitely seems to be a photosensitivity rash and could possibly repeat related to underlying rheumatologic condition. He is referred back to his clinic for referral to dermatology and/or rheumatology evaluation.  I saw and evaluated the patient, reviewed the resident's note and I agree with the findings and plan.   Dione Booze, MD 03/05/13 [phone]

## 2013-03-05 NOTE — ED Provider Notes (Signed)
History    CSN: [id number] Arrival date & time 03/05/13  1338  First MD Initiated Contact with Patient 03/05/13 1533     Chief Complaint  Patient presents with  . Rash   (Consider location/radiation/quality/duration/timing/severity/associated sxs/prior Treatment) Patient is a 37 y.o. male presenting with rash. The history is provided by the patient.  Rash Pain location: face. Pain quality: squeezing and stiffness   Pain radiates to:  Does not radiate Pain severity:  Moderate Onset quality:  Sudden Duration:  1 day (rash noted on hands and arms for several months, today noticed rash on face. ) Timing:  Constant Progression:  Worsening Chronicity:  Recurrent Context: not recent illness, not sick contacts and not suspicious food intake   Relieved by: was on course of prednisone by dermatologist with improvement in hands.  Worsened by:  Nothing tried Ineffective treatments:  None tried Associated symptoms: fatigue and fever (subjective. )   Associated symptoms: no anorexia, no chest pain, no chills, no diarrhea, no dysuria, no nausea, no shortness of breath, no sore throat and no vomiting    History reviewed. No pertinent past medical history. Past Surgical History  Procedure Laterality Date  . Cyst removal lung     Family History  Problem Relation Age of Onset  . Sarcoidosis Mother    History  Substance Use Topics  . Smoking status: Current Every Day Smoker -- 1.00 packs/day    Types: Cigarettes  . Smokeless tobacco: Never Used  . Alcohol Use: Yes     Comment: occasionally    Review of Systems  Constitutional: Positive for fever (subjective. ) and fatigue. Negative for chills.  HENT: Negative for congestion, sore throat, rhinorrhea and neck pain.   Respiratory: Negative for chest tightness and shortness of breath.   Cardiovascular: Negative for chest pain.  Gastrointestinal: Negative for nausea, vomiting, abdominal pain, diarrhea and anorexia.  Genitourinary:  Negative for dysuria.  Musculoskeletal: Negative for arthralgias.  Skin: Positive for rash.  Neurological: Negative for dizziness, weakness and headaches.  All other systems reviewed and are negative.    Allergies  Review of patient's allergies indicates no known allergies.  Home Medications   Current Outpatient Rx  Name  Route  Sig  Dispense  Refill  . predniSONE (DELTASONE) 20 MG tablet   Oral   Take 1 tablet (20 mg total) by mouth daily. Take in the morning with or without food   7 tablet   0   . traMADol-acetaminophen (ULTRACET) 37.5-325 MG per tablet   Oral   Take 1 tablet by mouth every 6 (six) hours as needed for pain.   30 tablet   0   . hydrOXYzine (ATARAX/VISTARIL) 25 MG tablet   Oral   Take 1 tablet (25 mg total) by mouth 3 (three) times daily as needed for itching.   65 tablet   0   . naproxen (NAPROSYN) 500 MG tablet   Oral   Take 1 tablet (500 mg total) by mouth 2 (two) times daily.   60 tablet   0    BP 125/76  Pulse 75  Temp(Src) 98.4 F (36.9 C) (Oral)  Resp 18  SpO2 100% Physical Exam  Nursing note and vitals reviewed. Constitutional: He is oriented to person, place, and time. He appears well-developed and well-nourished. No distress.  HENT:  Head: Normocephalic and atraumatic.  Right Ear: External ear normal.  Left Ear: External ear normal.  Mouth/Throat: Oropharynx is clear and moist.  Eyes: Pupils are equal, round,  and reactive to light.  Neck: Normal range of motion. Neck supple.  Cardiovascular: Normal rate, regular rhythm, normal heart sounds and intact distal pulses.  Exam reveals no gallop and no friction rub.   No murmur heard. Pulmonary/Chest: Effort normal and breath sounds normal. No respiratory distress. He has no wheezes. He has no rales.  Abdominal: Soft. There is no tenderness. There is no rebound and no guarding.  Musculoskeletal: Normal range of motion. He exhibits no edema and no tenderness.  Lymphadenopathy:    He has  no cervical adenopathy.  Neurological: He is alert and oriented to person, place, and time.  Skin: Skin is warm and dry. Rash noted. No erythema.  Dry, scaling skin noted diffusely over face that is flaking. Dry, scaly rash over dorsum of both hands and dorsal aspect of right forearm. No vesicular lesions.   Psychiatric: He has a normal mood and affect. His behavior is normal.    ED Course  Procedures (including critical care time) Labs Reviewed - No data to display No results found. 1. Rash     MDM  37yo male here with facial rash described as painful, itchy and "tight" that was noticed this am. 62mo ago noticed similar rash on hands. Has seen derm and seen in clinic on 5/21, treated with prednisone and hydroxyzine with improvement in hand symptoms. At this visit labs including ANA and anti SSa and SSb antibodies were check which were all negative. He presents now with what he states is a new facial rash, however there is mention of facial involvement in prior note. He is not systemically ill. Vitals stable. Rash stops at neckline so could be photosensitivity component. Will place on another course of prednisone given prior improvement. He will f/u with Dr. Izola Price who he saw before to discuss referral to derm and/or rheum. Also given number for derm and rheum in town. Pt understanding and dc'd home in stable condition.   Caren Hazy, MD 03/05/13 [phone]

## 2021-12-26 ENCOUNTER — Encounter (HOSPITAL_COMMUNITY): Payer: Self-pay

## 2021-12-26 ENCOUNTER — Other Ambulatory Visit: Payer: Self-pay

## 2021-12-26 ENCOUNTER — Emergency Department (HOSPITAL_COMMUNITY)
Admission: EM | Admit: 2021-12-26 | Discharge: 2021-12-26 | Disposition: A | Discharge status: Home / Self Care | Payer: Self-pay | Attending: Emergency Medicine | Admitting: Emergency Medicine

## 2021-12-26 DIAGNOSIS — R21 Rash and other nonspecific skin eruption: Secondary | ICD-10-CM | POA: Insufficient documentation

## 2021-12-26 MED ORDER — PREDNISONE 10 MG PO TABS: 20.0000 mg | ORAL_TABLET | Freq: Every day | ORAL | 0 refills | Status: AC

## 2021-12-26 MED ORDER — FAMOTIDINE 20 MG PO TABS: 20.0000 mg | ORAL_TABLET | Freq: Two times a day (BID) | ORAL | 0 refills | Status: AC

## 2021-12-26 MED ORDER — HYDROCORTISONE 1 % EX CREA
TOPICAL_CREAM | Freq: Two times a day (BID) | CUTANEOUS | Status: DC
  Filled 2021-12-26: qty 28

## 2021-12-26 MED ORDER — DIPHENHYDRAMINE HCL 25 MG PO CAPS
25.0000 mg | ORAL_CAPSULE | Freq: Once | ORAL | Status: AC
  Administered 2021-12-26: 25 mg via ORAL
  Filled 2021-12-26: qty 1

## 2021-12-26 MED ORDER — LIDOCAINE-EPINEPHRINE (PF) 2 %-1:200000 IJ SOLN: 20.0000 mL | Freq: Once | INTRAMUSCULAR | Status: DC

## 2021-12-26 MED ORDER — DIPHENHYDRAMINE HCL 25 MG PO TABS: 25.0000 mg | ORAL_TABLET | Freq: Four times a day (QID) | ORAL | 0 refills | Status: AC | PRN

## 2021-12-26 MED ORDER — FAMOTIDINE 20 MG PO TABS
20.0000 mg | ORAL_TABLET | Freq: Once | ORAL | Status: AC
  Administered 2021-12-26: 20 mg via ORAL
  Filled 2021-12-26: qty 1

## 2021-12-26 NOTE — ED Triage Notes (Signed)
Rash on face x 2 weeks worst after cutting grass. Denies shob, throat irritation, or facial swelling.  ?

## 2021-12-26 NOTE — Discharge Instructions (Addendum)
Take benadryl and pepcid as prescribed.  ?Prednisone as prescibed.  ?I recommend Vaseline to help with hydration of your skin.  I given the information for been dermatologist. ?

## 2021-12-26 NOTE — ED Provider Notes (Signed)
?[facility] DEPT ?Provider Note ? ? ?CSN: [id number] ?Arrival date & time: 12/26/21  1952 ? ?  ? ?History ? ?Chief Complaint  ?Patient presents with  ? Rash  ? ? ?Logan Huber is a 46 y.o. male. ? ? ?Rash ? ?Patient is a 46 year old male presented emergency room today with complaints of gradually progressively improving rash to his face and bilateral arms that began after cutting grass he states that he we did and did some weeding by hand as well.  He states he has a rash to the back of his bilateral hands and to his face.  He states that his itchy and occasionally weeps clear fluid.  He denies any fevers denies any worsening of the rash.  He states that he has had similar rash in the past when he did some activities and had improvement with steroid cream. ? ? ? ?  ? ?Home Medications ?Prior to Admission medications   ?Medication Sig Start Date End Date Taking? Authorizing Provider  ?diphenhydrAMINE (BENADRYL) 25 MG tablet Take 1 tablet (25 mg total) by mouth every 6 (six) hours as needed. 12/26/21  Yes Pati Gallo S, PA  ?famotidine (PEPCID) 20 MG tablet Take 1 tablet (20 mg total) by mouth 2 (two) times daily. 12/26/21  Yes Cassady Turano, Ova Freshwater S, PA  ?predniSONE (DELTASONE) 10 MG tablet Take 2 tablets (20 mg total) by mouth daily for 5 days. 12/26/21 12/31/21 Yes Tedd Sias, PA  ?hydrOXYzine (ATARAX/VISTARIL) 25 MG tablet Take 1 tablet (25 mg total) by mouth 3 (three) times daily as needed for itching. 01/31/13   Theodis Blaze, MD  ?naproxen (NAPROSYN) 500 MG tablet Take 1 tablet (500 mg total) by mouth 2 (two) times daily. 02/14/13   Liam Graham, PA-C  ?traMADol-acetaminophen (ULTRACET) 37.5-325 MG per tablet Take 1 tablet by mouth every 6 (six) hours as needed for pain. 02/14/13   Liam Graham, PA-C  ?   ? ?Allergies    ?Patient has no known allergies.   ? ?Review of Systems   ?Review of Systems  ?Skin:  Positive for rash.  ? ?Physical Exam ?Updated Vital Signs ?BP 120/77    Pulse 90   Temp 98 ?F (36.7 ?C)   Resp 16   Ht 5\' 10"  (1.778 m)   Wt 63.5 kg   SpO2 100%   BMI 20.09 kg/m?  ?Physical Exam ?Vitals and nursing note reviewed.  ?Constitutional:   ?   General: He is not in acute distress. ?HENT:  ?   Head: Normocephalic and atraumatic.  ?   Nose: Nose normal.  ?Eyes:  ?   General: No scleral icterus. ?Cardiovascular:  ?   Rate and Rhythm: Normal rate and regular rhythm.  ?   Pulses: Normal pulses.  ?   Heart sounds: Normal heart sounds.  ?Pulmonary:  ?   Effort: Pulmonary effort is normal. No respiratory distress.  ?   Breath sounds: No wheezing.  ?Abdominal:  ?   Palpations: Abdomen is soft.  ?   Tenderness: There is no abdominal tenderness.  ?Musculoskeletal:  ?   Cervical back: Normal range of motion.  ?   Right lower leg: No edema.  ?   Left lower leg: No edema.  ?Skin: ?   General: Skin is warm and dry.  ?   Capillary Refill: Capillary refill takes less than 2 seconds.  ?   Comments: Rash to bilateral cheeks and lower face also forehead.  Rash to  backs of bilateral hands becomes more diffuse as it goes up the arms no trunk rash.  No palmar lesions.  ?Neurological:  ?   Mental Status: He is alert. Mental status is at baseline.  ?Psychiatric:     ?   Mood and Affect: Mood normal.     ?   Behavior: Behavior normal.  ? ? ?ED Results / Procedures / Treatments   ?Labs ?(all labs ordered are listed, but only abnormal results are displayed) ?Labs Reviewed - No data to display ? ?EKG ?None ? ?Radiology ?No results found. ? ?Procedures ?Procedures  ? ? ?Medications Ordered in ED ?Medications  ?hydrocortisone cream 1 % ( Topical Given 12/26/21 2235)  ?diphenhydrAMINE (BENADRYL) capsule 25 mg (25 mg Oral Given 12/26/21 2234)  ?famotidine (PEPCID) tablet 20 mg (20 mg Oral Given 12/26/21 2234)  ? ? ?ED Course/ Medical Decision Making/ A&P ?  ?                        ?Medical Decision Making ?Risk ?OTC drugs. ?Prescription drug management. ? ? ?Patient is a 46 year old male presented  emergency room today with complaints of gradually progressively improving rash to his face and bilateral arms that began after cutting grass he states that he we did and did some weeding by hand as well.  He states he has a rash to the back of his bilateral hands and to his face.  He states that his itchy and occasionally weeps clear fluid.  He denies any fevers denies any worsening of the rash.  He states that he has had similar rash in the past when he did some activities and had improvement with steroid cream. ? ?Physical exam is notable for dry scaly rash to face and bilateral upper extremities particularly at the dorsum of both upper extremities. ?Rash is not consistent with syphilis. ? ?Patient denies any concern for sexually transmitted diseases.  Seems to have had similar rash with prior weed eating episodes.  Suspect contact dermatitis.  Prescribed Benadryl, Pepcid, prednisone and hydrocortisone cream was given here. ?He understands the importance of minimal topical steroid use on the face. ? ?Provided dermatology follow-up.  Return precautions discussed ? ? ?Final Clinical Impression(s) / ED Diagnoses ?Final diagnoses:  ?Rash  ? ? ?Rx / DC Orders ?ED Discharge Orders   ? ?      Ordered  ?  diphenhydrAMINE (BENADRYL) 25 MG tablet  Every 6 hours PRN       ? 12/26/21 2210  ?  famotidine (PEPCID) 20 MG tablet  2 times daily       ? 12/26/21 2210  ?  predniSONE (DELTASONE) 10 MG tablet  Daily       ? 12/26/21 2211  ? ?  ?  ? ?  ? ? ?  ?Tedd Sias, [place] ?12/26/21 2250 ? ?  ?Lacretia Leigh, MD ?12/27/21 1556 ? ?

## 2023-12-09 ENCOUNTER — Other Ambulatory Visit: Payer: Self-pay

## 2023-12-09 ENCOUNTER — Emergency Department (HOSPITAL_COMMUNITY): Admission: EM | Admit: 2023-12-09 | Discharge: 2023-12-09 | Disposition: A | Discharge status: Home / Self Care

## 2023-12-09 ENCOUNTER — Encounter (HOSPITAL_COMMUNITY): Payer: Self-pay

## 2023-12-09 DIAGNOSIS — R21 Rash and other nonspecific skin eruption: Secondary | ICD-10-CM | POA: Diagnosis present

## 2023-12-09 MED ORDER — TRIAMCINOLONE ACETONIDE 0.1 % EX CREA: 1.0000 | TOPICAL_CREAM | Freq: Two times a day (BID) | CUTANEOUS | 0 refills | Status: AC

## 2023-12-09 MED ORDER — CETIRIZINE HCL 10 MG PO TABS: 10.0000 mg | ORAL_TABLET | Freq: Every day | ORAL | 0 refills | Status: AC

## 2023-12-09 MED ORDER — DEXAMETHASONE 4 MG PO TABS
4.0000 mg | ORAL_TABLET | Freq: Once | ORAL | Status: AC
  Administered 2023-12-09: 4 mg via ORAL
  Filled 2023-12-09: qty 1

## 2023-12-09 NOTE — Discharge Instructions (Addendum)
 Please follow-up with your primary doctor.  We are also giving the number to a dermatologist that she can see.  Return immediately if you develop fevers, chills, tongue or lip swelling, difficulty breathing, difficulty swallowing or any new or worsening symptoms that are concerning to you.

## 2023-12-09 NOTE — ED Triage Notes (Signed)
 Pt c/o nasal congestion, swelling around eyes, and clear eye drainage x 1 week.

## 2023-12-09 NOTE — ED Provider Notes (Signed)
 [facility] EMERGENCY DEPARTMENT AT [facility] Provider Note   CSN: [id number] Arrival date & time: 12/09/23  1332     History  Chief Complaint  Patient presents with   Nasal Congestion    Logan Huber is a 48 y.o. male.  This is a 48 year old male presenting emergency department for rash to face.  This reportedly happens every year this time around pollen.  Swelling to face, redness, itchiness.  No angioedema tongue swelling.  No difficulty swallowing difficulty breathing.  No rash otherwise.  Has had triamcinolone in the past with good results.  Has tried over-the-counter steroid with no improvement.        Home Medications Prior to Admission medications   Medication Sig Start Date End Date Taking? Authorizing Provider  diphenhydrAMINE (BENADRYL) 25 MG tablet Take 1 tablet (25 mg total) by mouth every 6 (six) hours as needed. 12/26/21   Gailen Shelter, PA  famotidine (PEPCID) 20 MG tablet Take 1 tablet (20 mg total) by mouth 2 (two) times daily. 12/26/21   Gailen Shelter, PA  hydrOXYzine (ATARAX/VISTARIL) 25 MG tablet Take 1 tablet (25 mg total) by mouth 3 (three) times daily as needed for itching. 01/31/13   Dorothea Ogle, MD  naproxen (NAPROSYN) 500 MG tablet Take 1 tablet (500 mg total) by mouth 2 (two) times daily. 02/14/13   Baker, Adrian Blackwater, PA-C  traMADol-acetaminophen (ULTRACET) 37.5-325 MG per tablet Take 1 tablet by mouth every 6 (six) hours as needed for pain. 02/14/13   Graylon Good, PA-C      Allergies    Patient has no known allergies.    Review of Systems   Review of Systems  Physical Exam Updated Vital Signs BP (!) 135/107   Pulse (!) 114   Temp 98.8 F (37.1 C) (Oral)   Resp 16   Ht 5\' 11"  (1.803 m)   Wt 64.9 kg   SpO2 99%   BMI 19.94 kg/m  Physical Exam Vitals and nursing note reviewed.  Constitutional:      General: He is not in acute distress. HENT:     Head: Normocephalic.     Comments: Erythematous rash to face with edema  and flaking of skin.  No mucosal involvement.  No angioedema.  Uvula midline. Pulmonary:     Effort: Pulmonary effort is normal.     Breath sounds: Normal breath sounds.  Musculoskeletal:        General: Normal range of motion.  Skin:    General: Skin is warm.     Capillary Refill: Capillary refill takes less than 2 seconds.  Neurological:     Mental Status: He is alert and oriented to person, place, and time.     ED Results / Procedures / Treatments   Labs (all labs ordered are listed, but only abnormal results are displayed) Labs Reviewed - No data to display  EKG None  Radiology No results found.  Procedures Procedures    Medications Ordered in ED Medications - No data to display  ED Course/ Medical Decision Making/ A&P                                 Medical Decision Making 48 year old male present emergency department with facial rash.  This seemingly is a recurrent issue for him this time a year with pollen.  Does not appear to be anaphylactic.  Exam consistent with eczema type  rash.  It is quite extensive.  Will give a single dose of Decadron.  Discharged with Zyrtec and triamcinolone.  Amount and/or Complexity of Data Reviewed External Data Reviewed:     Details: presented multiple times for similar type rash.  Risk Diagnosis or treatment significantly limited by social determinants of health. Risk Details: No PCP         Final Clinical Impression(s) / ED Diagnoses Final diagnoses:  None    Rx / DC Orders ED Discharge Orders     None         Coral Spikes, DO 12/09/23 1407
